# Patient Record
Sex: Female | Born: 1985 | Race: Black or African American | Hispanic: No | Marital: Single | Smoking: Current every day smoker
Health system: Southern US, Community
[De-identification: ages and names within clinical notes are randomized; demographics above are authoritative.]

## PROBLEM LIST (undated history)

## (undated) DIAGNOSIS — J45909 Unspecified asthma, uncomplicated: Secondary | ICD-10-CM

## (undated) HISTORY — DX: Unspecified asthma, uncomplicated: J45.909

## (undated) HISTORY — PX: TUBAL LIGATION: SHX77

---
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
## 2007-05-12 ENCOUNTER — Emergency Department: Payer: Self-pay | Admitting: Emergency Medicine

## 2007-08-21 ENCOUNTER — Emergency Department: Payer: Self-pay | Admitting: Emergency Medicine

## 2008-07-31 ENCOUNTER — Emergency Department: Payer: Self-pay | Admitting: Emergency Medicine

## 2009-08-08 ENCOUNTER — Inpatient Hospital Stay: Payer: Self-pay | Admitting: Obstetrics and Gynecology

## 2009-11-30 ENCOUNTER — Emergency Department: Payer: Self-pay | Admitting: Emergency Medicine

## 2010-05-24 DIAGNOSIS — Z62810 Personal history of physical and sexual abuse in childhood: Secondary | ICD-10-CM | POA: Insufficient documentation

## 2010-05-24 DIAGNOSIS — J45909 Unspecified asthma, uncomplicated: Secondary | ICD-10-CM | POA: Insufficient documentation

## 2010-09-15 ENCOUNTER — Ambulatory Visit: Payer: Self-pay | Admitting: Advanced Practice Midwife

## 2010-11-18 ENCOUNTER — Observation Stay: Payer: Self-pay

## 2010-12-08 ENCOUNTER — Ambulatory Visit: Payer: Self-pay | Admitting: Obstetrics and Gynecology

## 2010-12-09 ENCOUNTER — Inpatient Hospital Stay: Payer: Self-pay

## 2012-01-18 ENCOUNTER — Emergency Department: Payer: Self-pay | Admitting: Unknown Physician Specialty

## 2012-02-07 IMAGING — US US OB US >=[ID] SNGL FETUS
1 series · 17 of 28 positions shown · non-contrast
Comparison: none

REASON FOR EXAM: dates
COMMENTS:

[Series 1: us ob us >=(id) sngl fetus · 17 of 93 slices shown]
[im 1/93]
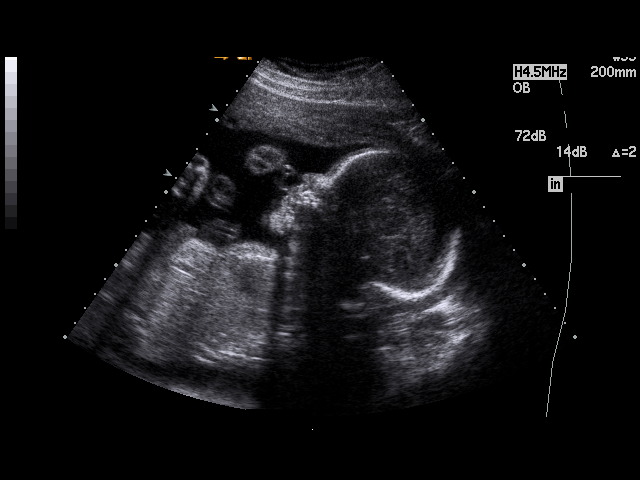
[im 7/93]
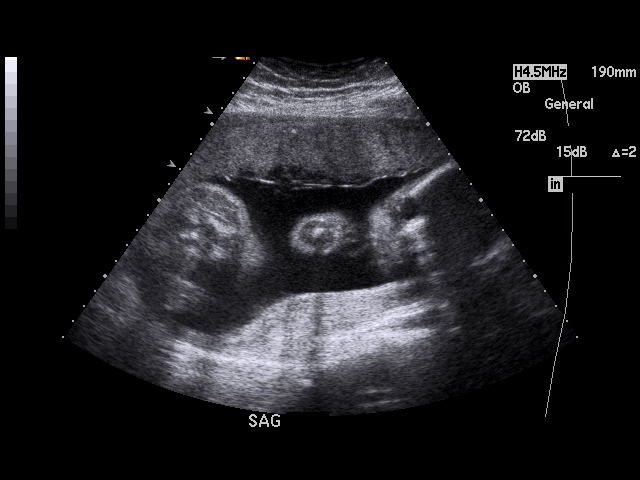
[im 14/93]
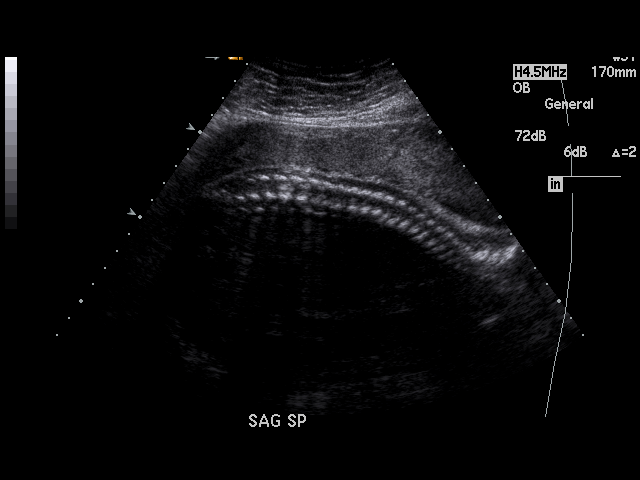
[im 18/93]
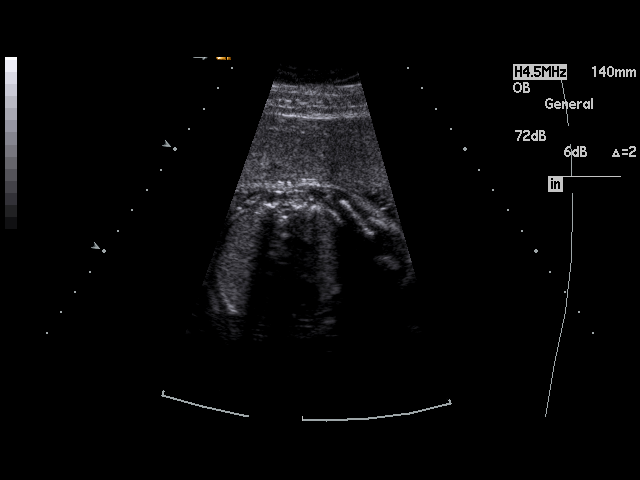
[im 24/93]
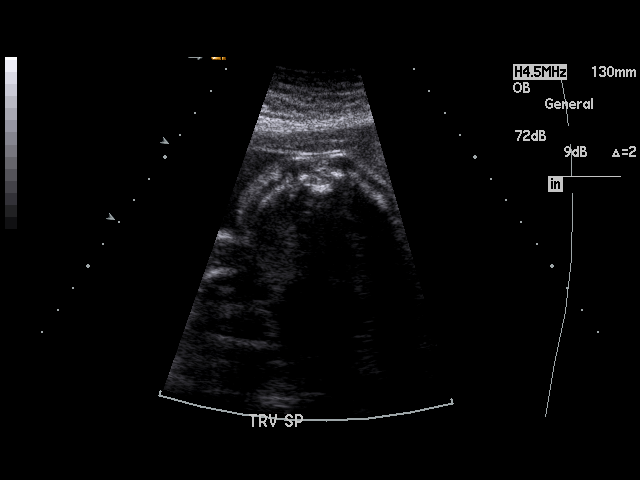
[im 31/93]
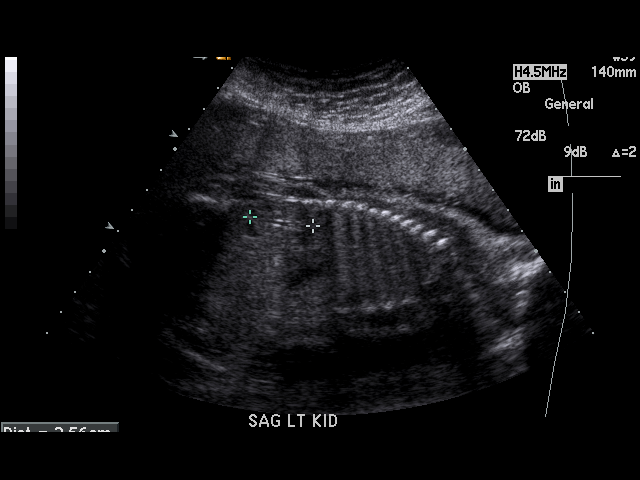
[im 35/93]
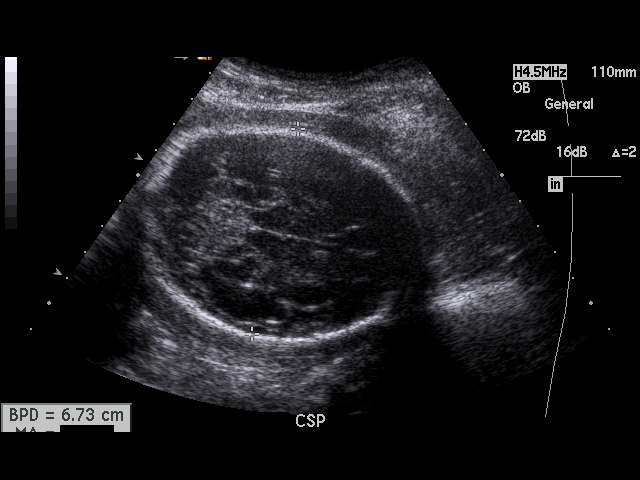
[im 41/93]
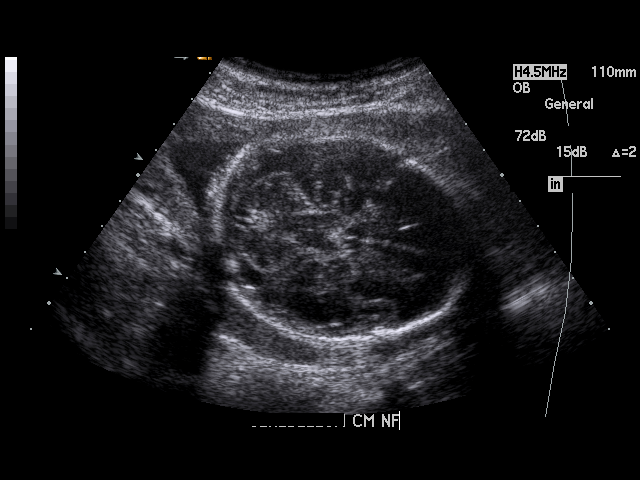
[im 48/93]
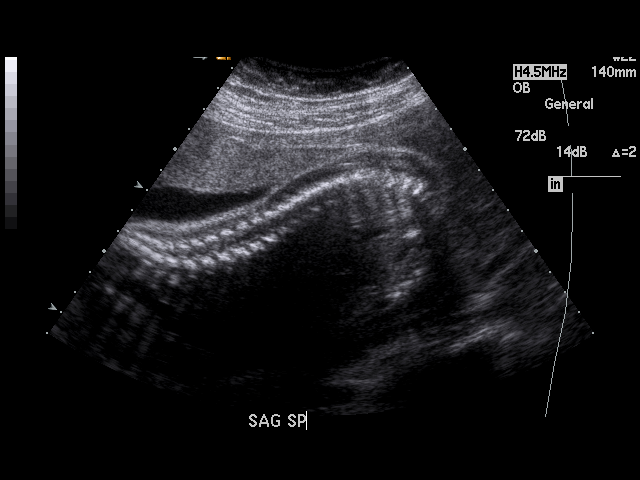
[im 52/93]
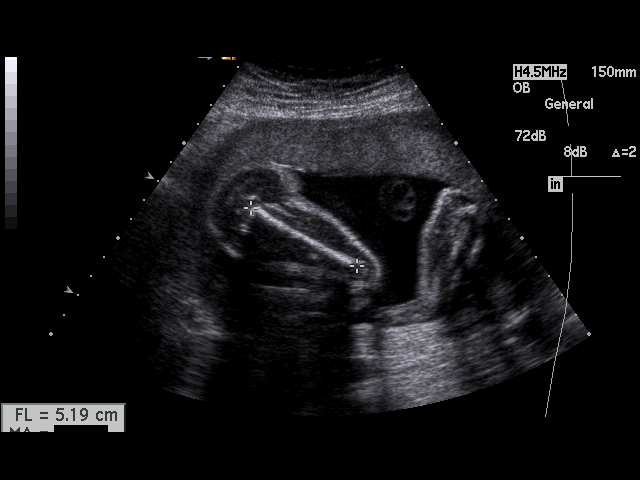
[im 58/93]
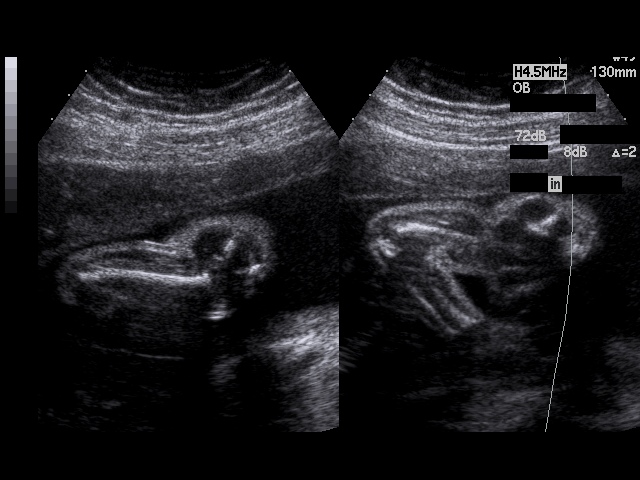
[im 62/93]
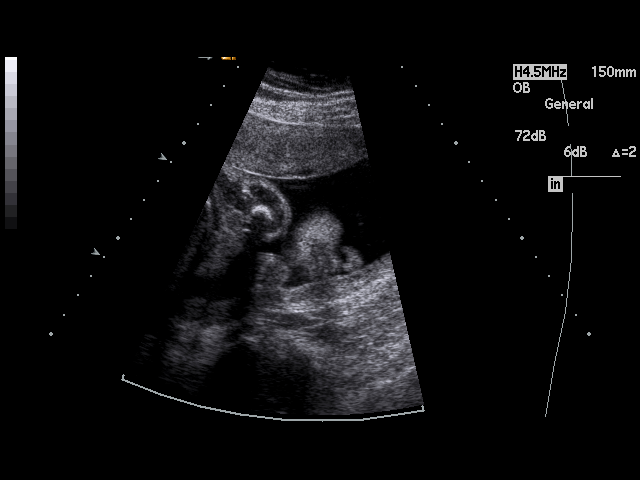
[im 69/93]
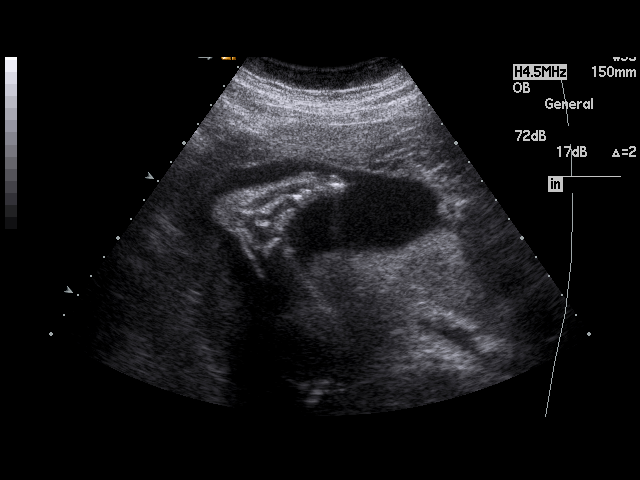
[im 75/93]
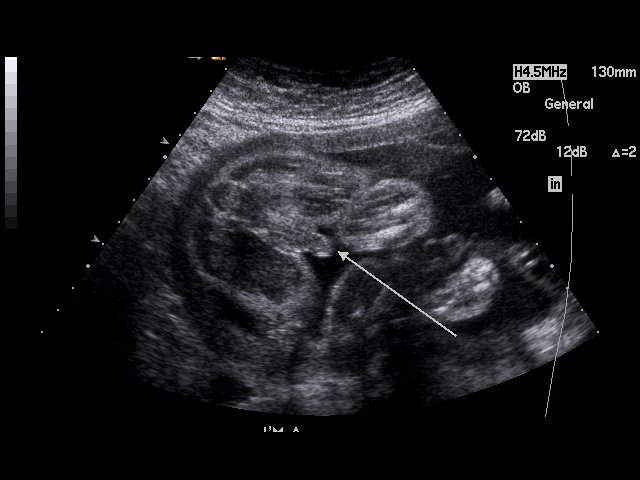
[im 79/93]
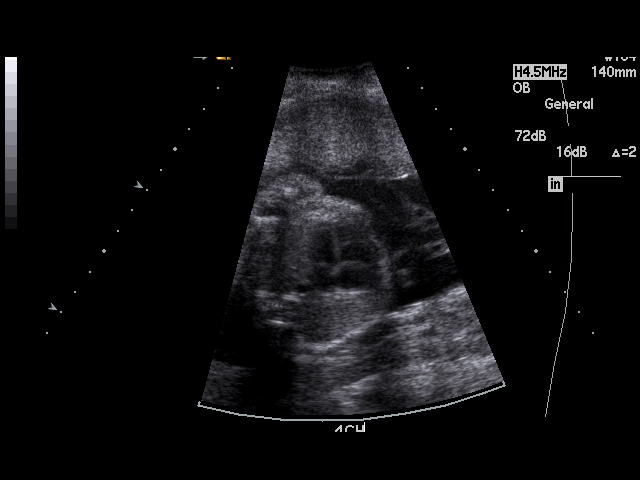
[im 86/93]
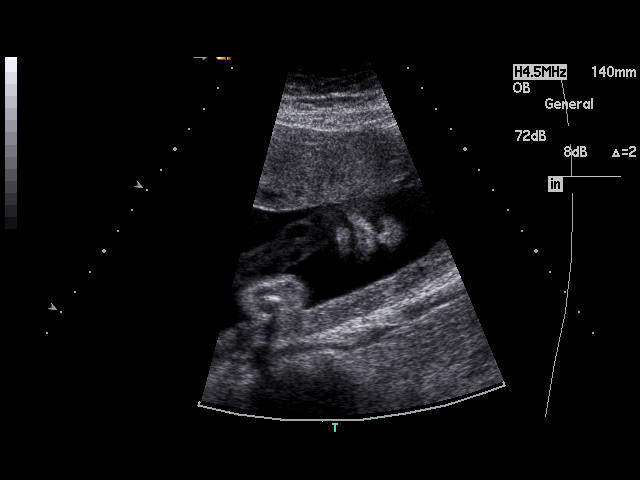
[im 93/93]
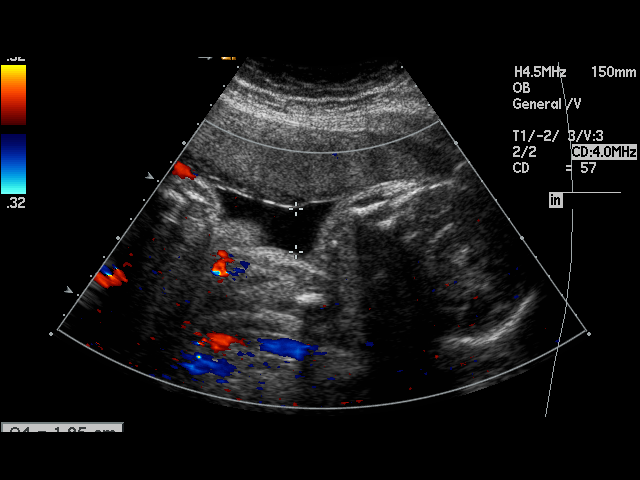

[17 of 28 positions shown; findings below may reference images not displayed]

PROCEDURE:     US  - US OB GREATER/OR EQUAL TO HGLZK  - September 15, 2010  [DATE]

RESULT:     OB ultrasound is performed and demonstrates a single
intrauterine fetus in cephalic presentation. The length of the cervix is
4.78 cm. The distance from the inferior margin of the anterior placenta to
the internal cervical os is 6.92 cm. The amniotic fluid index is measured at
15.35 cm. The fetal anatomy is grossly normal. The fetal heart rate is 144
beats per minute.
IMPRESSION: 1.     Single intrauterine gestation of 27 weeks-5 days + / - approximately 14
to 15 days. Ultrasound EDD is 12/12/2010. Estimated fetal weight is 2252 grams
+ / - 148 grams.

## 2012-04-24 ENCOUNTER — Emergency Department: Payer: Self-pay | Admitting: Emergency Medicine

## 2012-04-24 LAB — COMPREHENSIVE METABOLIC PANEL
Albumin: 3.9 g/dL (ref 3.4–5.0)
Alkaline Phosphatase: 87 U/L (ref 50–136)
Anion Gap: 5 — ABNORMAL LOW (ref 7–16)
Bilirubin,Total: 0.5 mg/dL (ref 0.2–1.0)
Chloride: 104 mmol/L (ref 98–107)
Co2: 27 mmol/L (ref 21–32)
Creatinine: 0.78 mg/dL (ref 0.60–1.30)
EGFR (Non-African Amer.): >60
Glucose: 114 mg/dL — ABNORMAL HIGH (ref 65–99)
Osmolality: 273 (ref 275–301)
Total Protein: 8.3 g/dL — ABNORMAL HIGH (ref 6.4–8.2)

## 2012-04-24 LAB — URINALYSIS, COMPLETE
Bilirubin,UR: NEGATIVE
Blood: NEGATIVE
Nitrite: NEGATIVE
Protein: 30
Squamous Epithelial: 43
WBC UR: 4 /HPF (ref 0–5)

## 2012-04-24 LAB — TSH: Thyroid Stimulating Horm: 1.32 u[IU]/mL

## 2012-04-24 LAB — CBC
HCT: 43.7 % (ref 35.0–47.0)
HGB: 14.6 g/dL (ref 12.0–16.0)
MCH: 29.8 pg (ref 26.0–34.0)
MCHC: 33.5 g/dL (ref 32.0–36.0)
MCV: 89 fL (ref 80–100)
Platelet: 196 10*3/uL (ref 150–440)

## 2012-04-24 LAB — SALICYLATE LEVEL: Salicylates, Serum: <1.7 mg/dL

## 2012-04-24 LAB — LIPASE, BLOOD: Lipase: 87 U/L (ref 73–393)

## 2012-08-16 LAB — HM PAP SMEAR

## 2012-12-03 ENCOUNTER — Inpatient Hospital Stay: Payer: Self-pay | Admitting: Obstetrics and Gynecology

## 2012-12-03 LAB — CBC WITH DIFFERENTIAL/PLATELET
Basophil #: 0 10*3/uL (ref 0.0–0.1)
Basophil %: 0 %
Eosinophil #: 0 10*3/uL (ref 0.0–0.7)
HCT: 33.4 % — ABNORMAL LOW (ref 35.0–47.0)
HGB: 10.8 g/dL — ABNORMAL LOW (ref 12.0–16.0)
Lymphocyte #: 0.9 10*3/uL — ABNORMAL LOW (ref 1.0–3.6)
Lymphocyte %: 7 %
MCH: 27.4 pg (ref 26.0–34.0)
MCV: 85 fL (ref 80–100)
Monocyte #: 1.1 x10 3/mm — ABNORMAL HIGH (ref 0.2–0.9)
Neutrophil #: 11.2 10*3/uL — ABNORMAL HIGH (ref 1.4–6.5)
Neutrophil %: 84.9 %
Platelet: 147 10*3/uL — ABNORMAL LOW (ref 150–440)
RBC: 3.94 10*6/uL (ref 3.80–5.20)
RDW: 14 % (ref 11.5–14.5)

## 2012-12-03 LAB — DRUG SCREEN, URINE
Barbiturates, Ur Screen: NEGATIVE (ref ?–200)
Benzodiazepine, Ur Scrn: NEGATIVE (ref ?–200)
Cannabinoid 50 Ng, Ur ~~LOC~~: POSITIVE (ref ?–50)
Cocaine Metabolite,Ur ~~LOC~~: NEGATIVE (ref ?–300)
MDMA (Ecstasy)Ur Screen: NEGATIVE (ref ?–500)
Methadone, Ur Screen: NEGATIVE (ref ?–300)

## 2012-12-04 LAB — PATHOLOGY REPORT

## 2012-12-04 LAB — HEMATOCRIT: HCT: 31.7 % — ABNORMAL LOW (ref 35.0–47.0)

## 2014-02-19 ENCOUNTER — Emergency Department: Payer: Self-pay | Admitting: Emergency Medicine

## 2014-12-26 NOTE — Op Note (Signed)
PATIENT NAME:  Darlene Mcgee, Darlene Mcgee MR#:  [id number] DATE OF BIRTH:  [birth year]  DATE OF PROCEDURE:  12/03/2012  PREOPERATIVE DIAGNOSES:  1.  37 + 6 weeks estimated gestational age.  2.  Active labor.  3.  Elective permanent sterilization. 4.  Prior cesarean section.   POSTOPERATIVE DIAGNOSES: 1.  37 + 6 weeks estimated gestational age.  2.  Active labor.  3.  Elective permanent sterilization. 4.  Prior cesarean section.  5.  Meconium staining of amniotic fluid.   PROCEDURE:  1.  Elective repeat low transverse cesarean section.  2.  Bilateral tubal ligation, [place].  3.  On-Q pump placement.   ANESTHESIA: Spinal.   SURGEON: Suzy Bouchardhomas J Rheana Casebolt, [name].   FIRST ASSISTANT: Ore, scrub tech.   INDICATION: This is a 29 year old, gravida 4, para 3 patient, who presented in active labor, cervix moving from 6 to 7 cm to 8 to 9 cm. She has had 2 prior cesarean sections. The patient has previously elected for permanent sterilization and reconfirms those desires the day of the procedure.   PROCEDURE: After adequate spinal anesthesia, the patient was placed in the dorsal supine position with a hip roll under the right side. The patient had previously received 2 grams IV Ancef for prophylaxis. Pfannenstiel incision was made 2 fingerbreadths above the symphysis pubis. Sharp dissection was used to identify the fascia. The fascia was opened in the midline and opened in a transverse fashion. Superior aspect of the fascia was grasped with Kocher clamps and the recti muscles dissected free. The inferior aspect of the fascia was grasped with Kocher clamps and the pyramidalis muscle was dissected free. Entry into the peritoneal cavity was accomplished sharply. The vesicouterine peritoneal fold was densely adherent and therefore a straight lower uterine segment incision was made. Upon entry into the endometrial cavity, meconium staining was noted. The incision was extended with blunt transverse traction. The fetal  head was delivered, followed by shoulders and body without difficulty. Cord doubly clamped and while passing the baby off to Dr. Abundio MiuHorowitz, the baby had a spontaneous respiration. Apgar scores assigned 5 and 9. Cord gas pH 7.0, CO2 91, base deficit of -8.1. The placenta had meconium staining and it was removed manually. The placenta will be sent to pathology for identification. Uterus was exteriorized and the endometrial cavity was wiped clean with laparotomy tape. Cervix was opened with a ring forceps and this was passed off the operative field. The uterine incision was closed with a running 1 chromic suture without difficulty. Good hemostasis noted. The posterior cul-de-sac was irrigated and suctioned.   Attention directed to the patient's right fallopian tube, which was grasped in the midportion and 2 separate 0 plain gut sutures were placed and a 1.5 cm portion of the fallopian tube removed. Good hemostasis was noted. A similar procedure was repeated on the patient's left fallopian tube. Again, 2-0 plain gut sutures were placed and a 1.5 cm portion of the fallopian tube removed. The uterus was placed back into the abdominal cavity without difficulty. The paracolic gutters were wiped clean with laparotomy tape. Both tubal ligation sites appeared hemostatic. Uterine incision appeared hemostatic. Interceed was placed over the lower uterine segment incision in a T-shaped fashion. The superior aspect of the fascia was grasped with Kocher clamps and elevated and the On-Q pump catheters were placed infraumbilically to a subfascial position. The fascia was then closed above these catheters with 0 Vicryl running nonlocking suture. Subcutaneous tissues were opened subcutaneously where there was previous  scar tissue and subcutaneous tissues were then irrigated and bovied for hemostasis, and the skin was reapproximated with staples. The On-Q pump catheters were then secured at the skin level with Dermabond and Steri-Strips  and Tegaderm. Each catheter was loaded with 5 mL of 0.5% Marcaine. There were no complications. Estimated blood loss 400 mL. Intraoperative fluids 600 mL. The patient tolerated the procedure well and was taken to the recovery room in good condition.    ____________________________ Suzy Bouchard, MD tjs:aw Mcgee: 12/03/2012 04:20:21 ET T: 12/03/2012 08:10:53 ET JOB#: [id number]  cc: Suzy Bouchard, MD, <Dictator> Suzy Bouchard MD ELECTRONICALLY SIGNED 12/10/2012 9:29

## 2014-12-26 NOTE — Discharge Summary (Signed)
PATIENT NAME:  Darlene Mcgee, Darlene Mcgee MR#:  [id number] DATE OF BIRTH:  01-24-1986  DATE OF ADMISSION:  12/03/2012 DATE OF DISCHARGE:  12/06/2012  PREOPERATIVE DIAGNOSIS: Term gestation, active labor, previous cesarean section.  DISCHARGE DIAGNOSIS: Repeat low transverse cesarean section.   HOSPITAL COURSE: The patient was admitted to [facility] in active labor, cervix 8 to 9 cm. The patient with a prior cesarean section. The patient underwent uncomplicated repeat low transverse cesarean section and tubal ligation. The patient's postop day number 1 hematocrit 31.7%. She was discharged home in good condition on postop day number 3.  The patient will follow up Dr. Feliberto GottronSchermerhorn in 2 weeks or before if she has wound drainage, fever, nausea, or vomiting.   DISCHARGE MEDICATIONS:  Norco, Motrin and prenatal vitamins.   DISCHARGE FOLLOWUP:  With Dr. Feliberto GottronSchermerhorn in 2 weeks. ____________________________ Suzy Bouchardhomas J. Schermerhorn, MD tjs:sb Mcgee: 12/12/2012 09:45:44 ET T: 12/12/2012 10:00:35 ET JOB#: [id number]  cc: Suzy Bouchardhomas J. Schermerhorn, MD, <Dictator> Suzy BouchardHOMAS J SCHERMERHORN MD ELECTRONICALLY SIGNED 12/17/2012 9:25

## 2015-01-04 ENCOUNTER — Emergency Department
Admission: EM | Admit: 2015-01-04 | Discharge: 2015-01-04 | Disposition: A | Discharge status: Home / Self Care | Payer: Self-pay | Attending: Student | Admitting: Student

## 2015-01-04 ENCOUNTER — Encounter: Payer: Self-pay | Admitting: *Deleted

## 2015-01-04 DIAGNOSIS — R109 Unspecified abdominal pain: Secondary | ICD-10-CM | POA: Insufficient documentation

## 2015-01-04 DIAGNOSIS — R112 Nausea with vomiting, unspecified: Secondary | ICD-10-CM | POA: Insufficient documentation

## 2015-01-04 DIAGNOSIS — Z3202 Encounter for pregnancy test, result negative: Secondary | ICD-10-CM | POA: Insufficient documentation

## 2015-01-04 DIAGNOSIS — R197 Diarrhea, unspecified: Secondary | ICD-10-CM | POA: Insufficient documentation

## 2015-01-04 DIAGNOSIS — Z720 Tobacco use: Secondary | ICD-10-CM | POA: Insufficient documentation

## 2015-01-04 LAB — CBC WITH DIFFERENTIAL/PLATELET
Basophils Absolute: 0.1 10*3/uL (ref 0–0.1)
Basophils Relative: 1 %
EOS ABS: 0 10*3/uL (ref 0–0.7)
HCT: 40.1 % (ref 35.0–47.0)
Hemoglobin: 13 g/dL (ref 12.0–16.0)
LYMPHS ABS: 0.8 10*3/uL — AB (ref 1.0–3.6)
Lymphocytes Relative: 7 %
MCH: 28.8 pg (ref 26.0–34.0)
MCHC: 32.4 g/dL (ref 32.0–36.0)
MCV: 88.9 fL (ref 80.0–100.0)
Monocytes Absolute: 0.3 10*3/uL (ref 0.2–0.9)
Neutro Abs: 9.5 10*3/uL — ABNORMAL HIGH (ref 1.4–6.5)
Neutrophils Relative %: 89 %
PLATELETS: 172 10*3/uL (ref 150–440)
RBC: 4.51 MIL/uL (ref 3.80–5.20)
RDW: 14.4 % (ref 11.5–14.5)
WBC: 10.6 10*3/uL (ref 3.6–11.0)

## 2015-01-04 LAB — COMPREHENSIVE METABOLIC PANEL
ALBUMIN: 3.5 g/dL (ref 3.5–5.0)
ALT: 16 U/L (ref 14–54)
ANION GAP: 6 (ref 5–15)
AST: 21 U/L (ref 15–41)
Alkaline Phosphatase: 45 U/L (ref 38–126)
BUN: 13 mg/dL (ref 6–20)
CHLORIDE: 110 mmol/L (ref 101–111)
CO2: 25 mmol/L (ref 22–32)
Calcium: 8.6 mg/dL — ABNORMAL LOW (ref 8.9–10.3)
Creatinine, Ser: 0.69 mg/dL (ref 0.44–1.00)
GFR calc Af Amer: >60 mL/min (ref >60)
GFR calc non Af Amer: >60 mL/min (ref >60)
Glucose, Bld: 128 mg/dL — ABNORMAL HIGH (ref 65–99)
POTASSIUM: 4.1 mmol/L (ref 3.5–5.1)
Sodium: 141 mmol/L (ref 135–145)
TOTAL PROTEIN: 6.3 g/dL — AB (ref 6.5–8.1)
Total Bilirubin: 0.4 mg/dL (ref 0.3–1.2)

## 2015-01-04 LAB — HCG, QUANTITATIVE, PREGNANCY

## 2015-01-04 LAB — LIPASE, BLOOD: LIPASE: 22 U/L (ref 22–51)

## 2015-01-04 MED ORDER — KETOROLAC TROMETHAMINE 30 MG/ML IJ SOLN
INTRAMUSCULAR | Status: AC
  Filled 2015-01-04: qty 1

## 2015-01-04 MED ORDER — ONDANSETRON HCL 4 MG/2ML IJ SOLN
4.0000 mg | Freq: Once | INTRAMUSCULAR | Status: AC
  Administered 2015-01-04: 4 mg via INTRAVENOUS

## 2015-01-04 MED ORDER — KETOROLAC TROMETHAMINE 30 MG/ML IJ SOLN
30.0000 mg | Freq: Once | INTRAMUSCULAR | Status: AC
Start: 2015-01-04 — End: 2015-01-04
  Administered 2015-01-04: 30 mg via INTRAVENOUS

## 2015-01-04 MED ORDER — OXYCODONE-ACETAMINOPHEN 5-325 MG PO TABS
ORAL_TABLET | ORAL | Status: AC
  Filled 2015-01-04: qty 1

## 2015-01-04 MED ORDER — ONDANSETRON HCL 4 MG PO TABS: 4.0000 mg | ORAL_TABLET | Freq: Three times a day (TID) | ORAL | Status: AC | PRN

## 2015-01-04 MED ORDER — SODIUM CHLORIDE 0.9 % IV BOLUS (SEPSIS)
1000.0000 mL | Freq: Once | INTRAVENOUS | Status: AC
  Administered 2015-01-04: 1000 mL via INTRAVENOUS

## 2015-01-04 MED ORDER — ONDANSETRON 4 MG PO TBDP
4.0000 mg | ORAL_TABLET | Freq: Once | ORAL | Status: AC
  Administered 2015-01-04: 4 mg via ORAL

## 2015-01-04 MED ORDER — NAPROXEN 500 MG PO TABS: 500.0000 mg | ORAL_TABLET | Freq: Two times a day (BID) | ORAL | Status: AC

## 2015-01-04 MED ORDER — OXYCODONE-ACETAMINOPHEN 5-325 MG PO TABS
1.0000 | ORAL_TABLET | Freq: Once | ORAL | Status: AC
  Administered 2015-01-04: 1 via ORAL

## 2015-01-04 MED ORDER — ONDANSETRON 4 MG PO TBDP
ORAL_TABLET | ORAL | Status: AC
  Filled 2015-01-04: qty 1

## 2015-01-04 MED ORDER — ONDANSETRON HCL 4 MG/2ML IJ SOLN
INTRAMUSCULAR | Status: AC
  Filled 2015-01-04: qty 2

## 2015-01-04 NOTE — ED Notes (Signed)
Unable to secure electronic signature from pt due to lack of pad. Pt verbalizes understanding of discharge instructions and followup.

## 2015-01-04 NOTE — ED Notes (Signed)
Pt states nausea improved.

## 2015-01-04 NOTE — ED Notes (Signed)
Pt reports that she started her period this morning and is experiencing n/v/d "like she does with every period".

## 2015-01-04 NOTE — ED Notes (Signed)
Pt given warm blanket and lights dimmed. Pt resting in bed with call bell in reach. Pt in no acute distress at this time. Pts daughter and girlfriend no longer at bedside.

## 2015-01-04 NOTE — ED Provider Notes (Signed)
[facility] Emergency Department Provider Note    ____________________________________________  Time seen: ----------------------------------------- 8:25 PM on 01/04/2015 -----------------------------------------    I have reviewed the triage vital signs and the nursing notes.   HISTORY  Chief Complaint Emesis     HPI Darlene Mcgee is a 29 y.o. female with history of asthma presents for evaluation of nausea vomiting diarrhea today. Nonbloody nonbilious emesis. She reports she started menstruating today and she has these symptoms monthly, they are associated with the onset of menstruation. She reports that she has them monthly. She has taken one Percocet at home and an over-the-counter menstrual pain reliever however these have not helped her symptoms. Onset sudden, since approximately 5 AM this morning. No new meds. No modifying factors. No fevers, chest pain, urinary complaints. She did drink alcohol last night.     History reviewed. No pertinent past medical history.  There are no active problems to display for this patient.   History reviewed. No pertinent past surgical history.  Current Outpatient Rx  Name  Route  Sig  Dispense  Refill  . naproxen (NAPROSYN) 500 MG tablet   Oral   Take 1 tablet (500 mg total) by mouth 2 (two) times daily with a meal.   8 tablet   0   . ondansetron (ZOFRAN) 4 MG tablet   Oral   Take 1 tablet (4 mg total) by mouth every 8 (eight) hours as needed for nausea or vomiting.   12 tablet   0     Allergies Review of patient's allergies indicates no known allergies.  No family history on file.  Social History History  Substance Use Topics  . Smoking status: Current Every Day Smoker  . Smokeless tobacco: Not on file  . Alcohol Use: Yes    Review of Systems  Constitutional: Negative for fever. Eyes: Negative for visual changes. ENT: Negative for sore throat. Cardiovascular: Negative for chest  pain. Respiratory: Negative for shortness of breath. Gastrointestinal: Positive for abdominal pain, vomiting and diarrhea. Genitourinary: Negative for dysuria. Musculoskeletal: Negative for back pain. Skin: Negative for rash. Neurological: Negative for headaches, focal weakness or numbness.   10-point ROS otherwise negative.  ____________________________________________   PHYSICAL EXAM:  VITAL SIGNS: ED Triage Vitals  Enc Vitals Group     BP 01/04/15 1643 153/86 mmHg     Pulse Rate 01/04/15 1643 78     Resp 01/04/15 1643 18     Temp 01/04/15 1643 98.5 F (36.9 C)     Temp Source 01/04/15 1643 Oral     SpO2 01/04/15 1643 98 %     Weight 01/04/15 1643 197 lb (89.359 kg)     Height 01/04/15 1643  (1.626 m)     Head Cir --      Peak Flow --      Pain Score 01/04/15 1644 8     Pain Loc --      Pain Edu? --      Excl. in GC? --      Constitutional: Alert and oriented. Well appearing and in no distress. Eyes: Conjunctivae are normal. PERRL. Normal extraocular movements. ENT   Head: Normocephalic and atraumatic.   Nose: No congestion/rhinnorhea.   Mouth/Throat: Mucous membranes are moist.   Neck: No stridor. Hematological/Lymphatic/Immunilogical: No cervical lymphadenopathy. Cardiovascular: Normal rate, regular rhythm. Normal and symmetric distal pulses are present in all extremities. No murmurs, rubs, or gallops. Respiratory: Normal respiratory effort without tachypnea nor retractions. Breath sounds are clear  and equal bilaterally. No wheezes/rales/rhonchi. Gastrointestinal: Soft and nontender. No distention. No abdominal bruits. There is no CVA tenderness. Musculoskeletal: Nontender with normal range of motion in all extremities. No joint effusions.  No lower extremity tenderness nor edema. Neurologic:  Normal speech and language. No gross focal neurologic deficits are appreciated. Speech is normal. No gait instability. Skin:  Skin is warm, dry and intact.  No rash noted. Psychiatric: Mood and affect are normal. Speech and behavior are normal. Patient exhibits appropriate insight and judgment.    ____________________________________________   PROCEDURES  Procedure(s) performed: None  Critical Care performed: No  ____________________________________________   INITIAL IMPRESSION / ASSESSMENT AND PLAN / ED COURSE  Pertinent labs & imaging results that were available during my care of the patient were reviewed by me and considered in my medical decision making (see chart for details).  Darlene D Dan HumphreysMebane is a 29 y.o. female with history of asthma presents for evaluation of nausea vomiting diarrhea today. This happens to her monthly. On exam she is very well-appearing and in no acute distress. Vital signs stable. Labs unremarkable. She is not pregnant. She has refused to make a urine specimen, it would be contaminated anyway, and she has no urinary complaints so we'll cancel. Symptoms have improved. Doubt any acute life-threatening intra-abdominal pathology. DC home with supportive care and PCP follow-up.  ____________________________________________   FINAL CLINICAL IMPRESSION(S) / ED DIAGNOSES  Final diagnoses:  Nausea vomiting and diarrhea     Gayla DossEryka A Aric Jost, MD 01/05/15 [phone]

## 2015-01-13 NOTE — H&P (Signed)
L&D Evaluation:  History:  HPI G4 p3 at 37+6 weeks with 2 prior c/s Pt scheduled for repeat c/s Active labor 6-7 cm. Pt recommitts to BTLEngineer, manufacturing( papaerwork signed)   Patient's Medical History depression , + tobacco , THC  uwse, HGSIL   Patient's Surgical History Previous C-Section  x 2   Medications Pre Natal Vitamins   Allergies NKDA   Social History tobacco   Family History Non-Contributory   ROS:  ROS All systems were reviewed.  HEENT, CNS, GI, GU, Respiratory, CV, Renal and Musculoskeletal systems were found to be normal.   Exam:  Vital Signs BP >140/90  151/89   General no apparent distress   Mental Status clear   Chest clear   Heart normal sinus rhythm   Abdomen gravid, non-tender   Pelvic 8-9 cm   Mebranes Intact   FHT normal rate with no decels   Ucx irregular, s/p terb x 2   Impression:  Impression active labor, elective repeat c/s + btl   Plan:  Plan repeat LTCS and + BTL , on Q pump   Electronic Signatures: Shannette Tabares, Ihor Austinhomas J (MD)  (Signed 31-Mar-14 03:17)  Authored: L&D Evaluation   Last Updated: 31-Mar-14 03:17 by Suzy BouchardSchermerhorn, Destani Wamser J (MD)

## 2015-05-08 DIAGNOSIS — Z8659 Personal history of other mental and behavioral disorders: Secondary | ICD-10-CM | POA: Insufficient documentation

## 2015-10-20 ENCOUNTER — Emergency Department
Admission: EM | Admit: 2015-10-20 | Discharge: 2015-10-20 | Disposition: A | Discharge status: Home / Self Care | Payer: Self-pay | Attending: Emergency Medicine | Admitting: Emergency Medicine

## 2015-10-20 ENCOUNTER — Encounter: Payer: Self-pay | Admitting: Emergency Medicine

## 2015-10-20 DIAGNOSIS — Z791 Long term (current) use of non-steroidal anti-inflammatories (NSAID): Secondary | ICD-10-CM | POA: Insufficient documentation

## 2015-10-20 DIAGNOSIS — R1084 Generalized abdominal pain: Secondary | ICD-10-CM | POA: Insufficient documentation

## 2015-10-20 DIAGNOSIS — R112 Nausea with vomiting, unspecified: Secondary | ICD-10-CM | POA: Insufficient documentation

## 2015-10-20 DIAGNOSIS — R109 Unspecified abdominal pain: Secondary | ICD-10-CM

## 2015-10-20 DIAGNOSIS — F17200 Nicotine dependence, unspecified, uncomplicated: Secondary | ICD-10-CM | POA: Insufficient documentation

## 2015-10-20 DIAGNOSIS — R197 Diarrhea, unspecified: Secondary | ICD-10-CM | POA: Insufficient documentation

## 2015-10-20 DIAGNOSIS — Z3202 Encounter for pregnancy test, result negative: Secondary | ICD-10-CM | POA: Insufficient documentation

## 2015-10-20 LAB — COMPREHENSIVE METABOLIC PANEL
ALBUMIN: 3.7 g/dL (ref 3.5–5.0)
ALT: 15 U/L (ref 14–54)
AST: 18 U/L (ref 15–41)
Alkaline Phosphatase: 53 U/L (ref 38–126)
Anion gap: 5 (ref 5–15)
BUN: 12 mg/dL (ref 6–20)
CO2: 24 mmol/L (ref 22–32)
Calcium: 8.6 mg/dL — ABNORMAL LOW (ref 8.9–10.3)
Chloride: 111 mmol/L (ref 101–111)
Creatinine, Ser: 0.67 mg/dL (ref 0.44–1.00)
GFR calc non Af Amer: >60 mL/min (ref >60)
GLUCOSE: 138 mg/dL — AB (ref 65–99)
POTASSIUM: 3.8 mmol/L (ref 3.5–5.1)
SODIUM: 140 mmol/L (ref 135–145)
TOTAL PROTEIN: 6.5 g/dL (ref 6.5–8.1)
Total Bilirubin: 0.8 mg/dL (ref 0.3–1.2)

## 2015-10-20 LAB — CBC
HEMATOCRIT: 40.8 % (ref 35.0–47.0)
HEMOGLOBIN: 13.4 g/dL (ref 12.0–16.0)
MCH: 28.9 pg (ref 26.0–34.0)
MCHC: 32.8 g/dL (ref 32.0–36.0)
MCV: 88.3 fL (ref 80.0–100.0)
Platelets: 158 10*3/uL (ref 150–440)
RBC: 4.62 MIL/uL (ref 3.80–5.20)
RDW: 14.6 % — ABNORMAL HIGH (ref 11.5–14.5)
WBC: 7.8 10*3/uL (ref 3.6–11.0)

## 2015-10-20 LAB — URINALYSIS COMPLETE WITH MICROSCOPIC (ARMC ONLY)
BILIRUBIN URINE: NEGATIVE
Glucose, UA: NEGATIVE mg/dL
Nitrite: NEGATIVE
PROTEIN: 100 mg/dL — AB
Specific Gravity, Urine: 1.027 (ref 1.005–1.030)
pH: 8 (ref 5.0–8.0)

## 2015-10-20 LAB — LIPASE, BLOOD: Lipase: 18 U/L (ref 11–51)

## 2015-10-20 LAB — POCT PREGNANCY, URINE: Preg Test, Ur: NEGATIVE

## 2015-10-20 MED ORDER — PROMETHAZINE HCL 25 MG/ML IJ SOLN
INTRAMUSCULAR | Status: AC
  Administered 2015-10-20: 25 mg via INTRAMUSCULAR
  Filled 2015-10-20: qty 1

## 2015-10-20 MED ORDER — IBUPROFEN 800 MG PO TABS
ORAL_TABLET | ORAL | Status: AC
  Administered 2015-10-20: 800 mg via ORAL
  Filled 2015-10-20: qty 1

## 2015-10-20 MED ORDER — IBUPROFEN 800 MG PO TABS: 800.0000 mg | ORAL_TABLET | Freq: Three times a day (TID) | ORAL | Status: DC | PRN

## 2015-10-20 MED ORDER — IBUPROFEN 800 MG PO TABS
800.0000 mg | ORAL_TABLET | Freq: Once | ORAL | Status: AC
  Administered 2015-10-20: 800 mg via ORAL

## 2015-10-20 MED ORDER — ONDANSETRON 4 MG PO TBDP
4.0000 mg | ORAL_TABLET | Freq: Once | ORAL | Status: AC | PRN
  Administered 2015-10-20: 4 mg via ORAL
  Filled 2015-10-20 (×2): qty 1

## 2015-10-20 MED ORDER — TRAMADOL HCL 50 MG PO TABS
ORAL_TABLET | ORAL | Status: AC
  Administered 2015-10-20: 100 mg via ORAL
  Filled 2015-10-20: qty 2

## 2015-10-20 MED ORDER — ONDANSETRON 4 MG PO TBDP: 4.0000 mg | ORAL_TABLET | Freq: Three times a day (TID) | ORAL | Status: DC | PRN

## 2015-10-20 MED ORDER — TRAMADOL HCL 50 MG PO TABS
100.0000 mg | ORAL_TABLET | Freq: Once | ORAL | Status: AC
  Administered 2015-10-20: 100 mg via ORAL

## 2015-10-20 MED ORDER — PROMETHAZINE HCL 25 MG/ML IJ SOLN
25.0000 mg | Freq: Once | INTRAMUSCULAR | Status: AC
  Administered 2015-10-20: 25 mg via INTRAMUSCULAR

## 2015-10-20 MED ORDER — TRAMADOL HCL 50 MG PO TABS: 50.0000 mg | ORAL_TABLET | Freq: Four times a day (QID) | ORAL | Status: AC | PRN

## 2015-10-20 NOTE — ED Notes (Signed)
Pt delayed from discharge for approx 30 min due to pt vomiting and c/o staying. MD notified and given orders to hold for a short period of time

## 2015-10-20 NOTE — Discharge Instructions (Signed)
Please take your medications as prescribed. Please follow-up with your OB/GYN as soon as possible for further evaluation. Return to the emergency department for any worsening abdominal pain or fever.   Abdominal Pain, Adult Many things can cause abdominal pain. Usually, abdominal pain is not caused by a disease and will improve without treatment. It can often be observed and treated at home. Your health care provider will do a physical exam and possibly order blood tests and X-rays to help determine the seriousness of your pain. However, in many cases, more time must pass before a clear cause of the pain can be found. Before that point, your health care provider may not know if you need more testing or further treatment. HOME CARE INSTRUCTIONS Monitor your abdominal pain for any changes. The following actions may help to alleviate any discomfort you are experiencing:  Only take over-the-counter or prescription medicines as directed by your health care provider.  Do not take laxatives unless directed to do so by your health care provider.  Try a clear liquid diet (broth, tea, or water) as directed by your health care provider. Slowly move to a bland diet as tolerated. SEEK MEDICAL CARE IF:  You have unexplained abdominal pain.  You have abdominal pain associated with nausea or diarrhea.  You have pain when you urinate or have a bowel movement.  You experience abdominal pain that wakes you in the night.  You have abdominal pain that is worsened or improved by eating food.  You have abdominal pain that is worsened with eating fatty foods.  You have a fever. SEEK IMMEDIATE MEDICAL CARE IF:  Your pain does not go away within 2 hours.  You keep throwing up (vomiting).  Your pain is felt only in portions of the abdomen, such as the right side or the left lower portion of the abdomen.  You pass bloody or black tarry stools. MAKE SURE YOU:  Understand these instructions.  Will watch  your condition.  Will get help right away if you are not doing well or get worse.   This information is not intended to replace advice given to you by your health care provider. Make sure you discuss any questions you have with your health care provider.   Document Released: 06/01/2005 Document Revised: 05/13/2015 Document Reviewed: 05/01/2013 Elsevier Interactive Patient Education 2016 [employer]  Nausea and Vomiting Nausea means you feel sick to your stomach. Throwing up (vomiting) is a reflex where stomach contents come out of your mouth. HOME CARE   Take medicine as told by your doctor.  Do not force yourself to eat. However, you do need to drink fluids.  If you feel like eating, eat a normal diet as told by your doctor.  Eat rice, wheat, potatoes, bread, lean meats, yogurt, fruits, and vegetables.  Avoid high-fat foods.  Drink enough fluids to keep your pee (urine) clear or pale yellow.  Ask your doctor how to replace body fluid losses (rehydrate). Signs of body fluid loss (dehydration) include:  Feeling very thirsty.  Dry lips and mouth.  Feeling dizzy.  Dark pee.  Peeing less than normal.  Feeling confused.  Fast breathing or heart rate. GET HELP RIGHT AWAY IF:   You have blood in your throw up.  You have black or bloody poop (stool).  You have a bad headache or stiff neck.  You feel confused.  You have bad belly (abdominal) pain.  You have chest pain or trouble breathing.  You do not pee  at least once every 8 hours.  You have cold, clammy skin.  You keep throwing up after 24 to 48 hours.  You have a fever. MAKE SURE YOU:   Understand these instructions.  Will watch your condition.  Will get help right away if you are not doing well or get worse.   This information is not intended to replace advice given to you by your health care provider. Make sure you discuss any questions you have with your health care provider.   Document  Released: 02/08/2008 Document Revised: 11/14/2011 Document Reviewed: 01/21/2011 Elsevier Interactive Patient Education [employer]

## 2015-10-20 NOTE — ED Notes (Signed)
Pt c/o vomiting and diarrhea since last night.  Stated period and per friend sometimes vomits with period but not this much. Having difficulty keeping food down.

## 2015-10-20 NOTE — ED Notes (Signed)
Lab called to add on UA.  ?

## 2015-10-20 NOTE — ED Provider Notes (Signed)
[facility] Emergency Department Provider Note  Time seen: 4:59 PM  I have reviewed the triage vital signs and the nursing notes.   HISTORY  Chief Complaint Emesis    HPI Darlene Mcgee is a 30 y.o. female with no past medical history who presents to the emergency department with abdominal cramping, nausea, vomiting, diarrhea. According to the patient every month she gets the same symptoms with her. Some months are worse than others, she states this month has been very significant with the nausea and the vomiting along with lower abdominal cramping and diarrhea. Patient states she cannot go to work last night due to the pain, and does not believe she can go to work tonight, so she came to the emergency department per patient. Patient is having difficulty keeping food down. Received Zofran during triage, states the nausea has improved, continues to have loose stool her last bowel movement was approximately 2 hours ago. Denies fever.     History reviewed. No pertinent past medical history.  There are no active problems to display for this patient.   Past Surgical History  Procedure Laterality Date  . Cesarean section      Current Outpatient Rx  Name  Route  Sig  Dispense  Refill  . naproxen (NAPROSYN) 500 MG tablet   Oral   Take 1 tablet (500 mg total) by mouth 2 (two) times daily with a meal.   8 tablet   0   . ondansetron (ZOFRAN) 4 MG tablet   Oral   Take 1 tablet (4 mg total) by mouth every 8 (eight) hours as needed for nausea or vomiting.   12 tablet   0     Allergies Shellfish allergy  History reviewed. No pertinent family history.  Social History Social History  Substance Use Topics  . Smoking status: Current Every Day Smoker  . Smokeless tobacco: None  . Alcohol Use: Yes    Review of Systems Constitutional: Negative for fever. Cardiovascular: Negative for chest pain. Respiratory: Negative for shortness of  breath. Gastrointestinal: Abdominal cramping. Positive for nausea, vomiting, diarrhea. Genitourinary: Negative for dysuria. Currently on her period. Musculoskeletal: Negative for back pain. Neurological: Negative for headache 10-point ROS otherwise negative.  ____________________________________________   PHYSICAL EXAM:  VITAL SIGNS: ED Triage Vitals  Enc Vitals Group     BP 10/20/15 1618 158/89 mmHg     Pulse Rate 10/20/15 1618 57     Resp 10/20/15 1618 18     Temp 10/20/15 1618 98.3 F (36.8 C)     Temp Source 10/20/15 1618 Oral     SpO2 10/20/15 1618 98 %     Weight 10/20/15 1618 195 lb (88.451 kg)     Height 10/20/15 1618  (1.651 m)     Head Cir --      Peak Flow --      Pain Score 10/20/15 1617 10     Pain Loc --      Pain Edu? --      Excl. in GC? --     Constitutional: Alert and oriented. Well appearing and in no distress. Eyes: Normal exam ENT   Head: Normocephalic and atraumatic.   Mouth/Throat: Mucous membranes are moist. Cardiovascular: Normal rate, regular rhythm. No murmur Respiratory: Normal respiratory effort without tachypnea nor retractions. Breath sounds are clear Gastrointestinal: Soft, mild diffuse abdominal tenderness without focal tenderness. No rebound or guarding. No distention. Musculoskeletal: Nontender with normal range of motion in all extremities.  Neurologic:  Normal speech and language. No gross focal neurologic deficits Skin:  Skin is warm, dry and intact.  Psychiatric: Mood and affect are normal.   ____________________________________________   INITIAL IMPRESSION / ASSESSMENT AND PLAN / ED COURSE  Pertinent labs & imaging results that were available during my care of the patient were reviewed by me and considered in my medical decision making (see chart for details).  Patient presents with abdominal cramping, diarrhea, nausea or vomiting. Patient states she gets these symptoms every month with her period, but this month  it is more severe. Denies fever. Patient's labs are within normal limits. I discussed the possibility of gastroenteritis, however the patient states she is sure it is just her period Because this happens every month and feels the same per patient. Patient's labs are reassuring, no focal tenderness on examination. We will discharge with Zofran, Motrin, and a short course of Ultram. Patient is to follow up with her OB/GYN.  ____________________________________________   FINAL CLINICAL IMPRESSION(S) / ED DIAGNOSES  Abdominal cramping Nausea, vomiting, diarrhea   Minna Antis, MD 10/20/15 [id number]

## 2018-02-21 ENCOUNTER — Emergency Department
Admission: EM | Admit: 2018-02-21 | Discharge: 2018-02-21 | Disposition: A | Discharge status: Home / Self Care | Payer: Self-pay | Attending: Emergency Medicine | Admitting: Emergency Medicine

## 2018-02-21 ENCOUNTER — Other Ambulatory Visit: Payer: Self-pay

## 2018-02-21 ENCOUNTER — Emergency Department: Payer: Self-pay

## 2018-02-21 DIAGNOSIS — S62661B Nondisplaced fracture of distal phalanx of left index finger, initial encounter for open fracture: Secondary | ICD-10-CM | POA: Insufficient documentation

## 2018-02-21 DIAGNOSIS — S61201A Unspecified open wound of left index finger without damage to nail, initial encounter: Secondary | ICD-10-CM | POA: Insufficient documentation

## 2018-02-21 DIAGNOSIS — Y929 Unspecified place or not applicable: Secondary | ICD-10-CM | POA: Insufficient documentation

## 2018-02-21 DIAGNOSIS — W230XXA Caught, crushed, jammed, or pinched between moving objects, initial encounter: Secondary | ICD-10-CM | POA: Insufficient documentation

## 2018-02-21 DIAGNOSIS — Z23 Encounter for immunization: Secondary | ICD-10-CM | POA: Insufficient documentation

## 2018-02-21 DIAGNOSIS — S61209A Unspecified open wound of unspecified finger without damage to nail, initial encounter: Secondary | ICD-10-CM

## 2018-02-21 DIAGNOSIS — Y999 Unspecified external cause status: Secondary | ICD-10-CM | POA: Insufficient documentation

## 2018-02-21 DIAGNOSIS — F17200 Nicotine dependence, unspecified, uncomplicated: Secondary | ICD-10-CM | POA: Insufficient documentation

## 2018-02-21 DIAGNOSIS — Y939 Activity, unspecified: Secondary | ICD-10-CM | POA: Insufficient documentation

## 2018-02-21 MED ORDER — OXYCODONE-ACETAMINOPHEN 5-325 MG PO TABS
1.0000 | ORAL_TABLET | ORAL | Status: DC | PRN
  Administered 2018-02-21: 1 via ORAL
  Filled 2018-02-21: qty 1

## 2018-02-21 MED ORDER — IBUPROFEN 800 MG PO TABS: 800.0000 mg | ORAL_TABLET | Freq: Three times a day (TID) | ORAL | 0 refills | Status: DC | PRN

## 2018-02-21 MED ORDER — CLINDAMYCIN HCL 300 MG PO CAPS: 300.0000 mg | ORAL_CAPSULE | Freq: Three times a day (TID) | ORAL | 0 refills | Status: AC

## 2018-02-21 MED ORDER — OXYCODONE-ACETAMINOPHEN 5-325 MG PO TABS
1.0000 | ORAL_TABLET | Freq: Once | ORAL | Status: AC
  Administered 2018-02-21: 1 via ORAL
  Filled 2018-02-21: qty 1

## 2018-02-21 MED ORDER — LIDOCAINE HCL (PF) 1 % IJ SOLN
INTRAMUSCULAR | Status: AC
  Filled 2018-02-21: qty 5

## 2018-02-21 MED ORDER — OXYCODONE-ACETAMINOPHEN 5-325 MG PO TABS: 1.0000 | ORAL_TABLET | Freq: Four times a day (QID) | ORAL | 0 refills | Status: AC | PRN

## 2018-02-21 MED ORDER — LIDOCAINE HCL (PF) 1 % IJ SOLN
2.0000 mL | Freq: Once | INTRAMUSCULAR | Status: AC
  Administered 2018-02-21: 2 mL

## 2018-02-21 MED ORDER — LIDOCAINE HCL (PF) 1 % IJ SOLN
INTRAMUSCULAR | Status: AC
  Administered 2018-02-21: 2 mL
  Filled 2018-02-21: qty 5

## 2018-02-21 MED ORDER — TETANUS-DIPHTH-ACELL PERTUSSIS 5-2.5-18.5 LF-MCG/0.5 IM SUSP
0.5000 mL | Freq: Once | INTRAMUSCULAR | Status: AC
  Administered 2018-02-21: 0.5 mL via INTRAMUSCULAR
  Filled 2018-02-21: qty 0.5

## 2018-02-21 NOTE — ED Notes (Signed)
Pt ambulatory upon discharge; declined wheel chair. Verbalized understanding of discharge instructions, follow-up care, prescriptions, signs & symptoms of infection and splint care. VSS. A&O x4.

## 2018-02-21 NOTE — ED Notes (Signed)
ED Provider at bedside. 

## 2018-02-21 NOTE — ED Triage Notes (Addendum)
Left index finger shut in house door. Bleeding controlled. Nail and tip of finger gone.

## 2018-02-21 NOTE — ED Notes (Signed)
EDP aware pain has not decreased. Provider on her way in to numb area for further assessment.

## 2018-02-21 NOTE — ED Provider Notes (Signed)
[facility] Emergency Department Provider Note  ____________________________________________  Time seen: Approximately 2:29 PM  I have reviewed the triage vital signs and the nursing notes.   HISTORY  Chief Complaint Finger Injury    HPI Darlene Mcgee is a 32 y.o. female that presents emergency department for finger injury.  Patient states that she slammed her finger in a door. She is unsure of last tetanus. No additional injuries.   History reviewed. No pertinent past medical history.  There are no active problems to display for this patient.   Past Surgical History:  Procedure Laterality Date  . CESAREAN SECTION      Prior to Admission medications   Medication Sig Start Date End Date Taking? Authorizing Provider  clindamycin (CLEOCIN) 300 MG capsule Take 1 capsule (300 mg total) by mouth 3 (three) times daily for 10 days. 02/21/18 03/03/18  Enid DerryWagner, Enna Warwick, PA-C  ibuprofen (ADVIL,MOTRIN) 800 MG tablet Take 1 tablet (800 mg total) by mouth every 8 (eight) hours as needed. 02/21/18   Enid DerryWagner, Rafaelita Foister, PA-C  ondansetron (ZOFRAN ODT) 4 MG disintegrating tablet Take 1 tablet (4 mg total) by mouth every 8 (eight) hours as needed for nausea or vomiting. 10/20/15   Minna AntisPaduchowski, Kevin, MD  oxyCODONE-acetaminophen (PERCOCET) 5-325 MG tablet Take 1 tablet by mouth every 6 (six) hours as needed for up to 3 days for severe pain. 02/21/18 02/24/18  Enid DerryWagner, Leslie Jester, PA-C    Allergies Shellfish allergy  No family history on file.  Social History Social History   Tobacco Use  . Smoking status: Current Every Day Smoker  Substance Use Topics  . Alcohol use: Yes  . Drug use: No     Review of Systems  Gastrointestinal:  No nausea, no vomiting.  Musculoskeletal: Positive for finger pain  Skin: Negative for rash, ecchymosis. Neurological: Negative for numbness or tingling   ____________________________________________   PHYSICAL EXAM:  VITAL SIGNS: ED Triage  Vitals [02/21/18 1353]  Enc Vitals Group     BP (!) 140/100     Pulse Rate 99     Resp 18     Temp 99.4 F (37.4 C)     Temp Source Oral     SpO2 99 %     Weight 195 lb (88.5 kg)     Height      Head Circumference      Peak Flow      Pain Score 10     Pain Loc      Pain Edu?      Excl. in GC?      Constitutional: Alert and oriented. Well appearing and in no acute distress. Eyes: Conjunctivae are normal. PERRL. EOMI. Head: Atraumatic. ENT:      Ears:      Nose: No congestion/rhinnorhea.      Mouth/Throat: Mucous membranes are moist.  Neck: No stridor.  Cardiovascular: Normal rate, regular rhythm.  Good peripheral circulation. Respiratory: Normal respiratory effort without tachypnea or retractions. Lungs CTAB. Good air entry to the bases with no decreased or absent breath sounds. Musculoskeletal: Full range of motion to all extremities. No gross deformities appreciated. Neurologic:  Normal speech and language. No gross focal neurologic deficits are appreciated.  Skin:  Skin is warm, dry. Avulsion to tip of distal left index finger removing fingernail. No visible bone.  Psychiatric: Mood and affect are normal. Speech and behavior are normal. Patient exhibits appropriate insight and judgement.   ____________________________________________   LABS (all labs ordered are listed, but  only abnormal results are displayed)  Labs Reviewed - No data to display ____________________________________________  EKG   ____________________________________________  RADIOLOGY   Dg Finger Index Left  Result Date: 02/21/2018 CLINICAL DATA:  Finger shut in door EXAM: LEFT SECOND FINGER 2+V COMPARISON:  None. FINDINGS: Frontal, oblique, and lateral views were obtained. There is avulsion of the distal aspect of the second digit. There is a an obliquely oriented fracture involving the distal aspect of the second distal phalanx with alignment essentially anatomic. No other fracture. No  dislocation. Joint spaces appear normal. IMPRESSION: Fracture distal aspect second distal phalanx with alignment essentially anatomic. 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 No other fractures are evident. No dislocation or arthropathic change. Electronically Signed   By: Bretta Bang III M.D.   On: 02/21/2018 14:43    ____________________________________________    PROCEDURES  Procedure(s) performed:    Procedures    Medications  oxyCODONE-acetaminophen (PERCOCET/ROXICET) 5-325 MG per tablet 1 tablet (1 tablet Oral Given 02/21/18 1425)  lidocaine (PF) (XYLOCAINE) 1 % injection 2 mL (2 mLs Other Given 02/21/18 1504)  Tdap (BOOSTRIX) injection 0.5 mL (0.5 mLs Intramuscular Given 02/21/18 1612)  oxyCODONE-acetaminophen (PERCOCET/ROXICET) 5-325 MG per tablet 1 tablet (1 tablet Oral Given 02/21/18 1614)     ____________________________________________   INITIAL IMPRESSION / ASSESSMENT AND PLAN / ED COURSE  Pertinent labs & imaging results that were available during my care of the patient were reviewed by me and considered in my medical decision making (see chart for details).  Review of the  [place] was performed in accordance of the [facility] prior to dispensing any controlled drugs.     Patient's diagnosis is consistent with fingertip avulsion and open distal nondisplaced fracture. I do not see anything able to be repaired.  Wound was extensively cleaned with saline and iodine. Non adherent dressing and splint were placed. Tetanus shot was updated.  Patient will be discharged home with prescriptions for percocet, clindamycin, ibuprofen. Patient is to follow up with orthopedics as directed. Patient is given ED precautions to return to the ED for any worsening or new symptoms.     ____________________________________________  FINAL CLINICAL IMPRESSION(S) / ED DIAGNOSES  Final diagnoses:   Avulsion of finger tip, initial encounter  Open nondisplaced fracture of distal phalanx of left index finger, initial encounter      NEW MEDICATIONS STARTED DURING THIS VISIT:  ED Discharge Orders        Ordered    oxyCODONE-acetaminophen (PERCOCET) 5-325 MG tablet  Every 6 hours PRN     02/21/18 1554    ibuprofen (ADVIL,MOTRIN) 800 MG tablet  Every 8 hours PRN     02/21/18 1554    clindamycin (CLEOCIN) 300 MG capsule  3 times daily     02/21/18 1616          This chart was dictated using voice recognition software/Dragon. Despite best efforts to proofread, errors can occur which can change the meaning. Any change was purely unintentional.    Enid Derry, PA-C 02/21/18 1837    Jene Every, MD 02/23/18 [phone]

## 2018-05-09 LAB — HM HIV SCREENING LAB: HM HIV Screening: NEGATIVE

## 2019-07-16 IMAGING — DX DG FINGER INDEX 2+V*L*
3 series · 3 of 3 positions shown · non-contrast
Comparison: None.

CLINICAL DATA: Finger shut in door

EXAM:
LEFT SECOND FINGER 2+V

[finger ap]
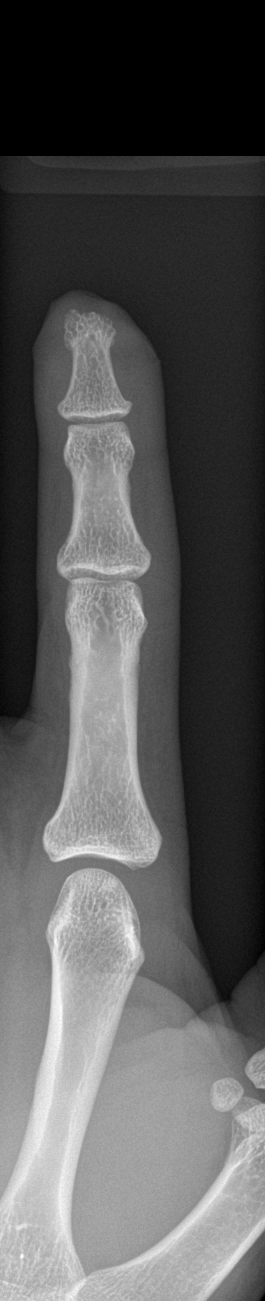

[finger obl]
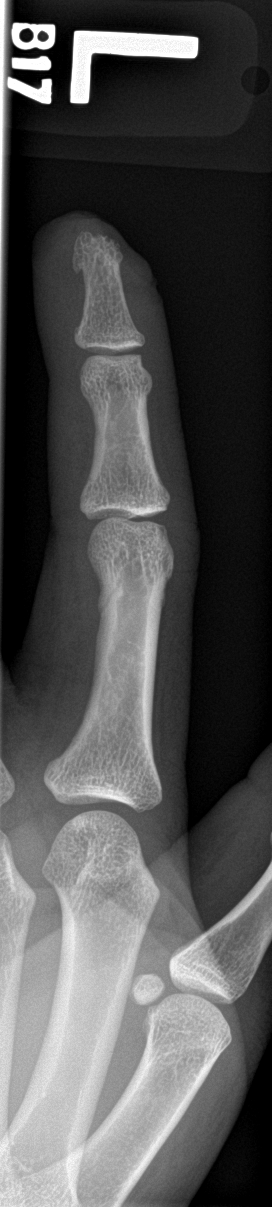

[finger lat]
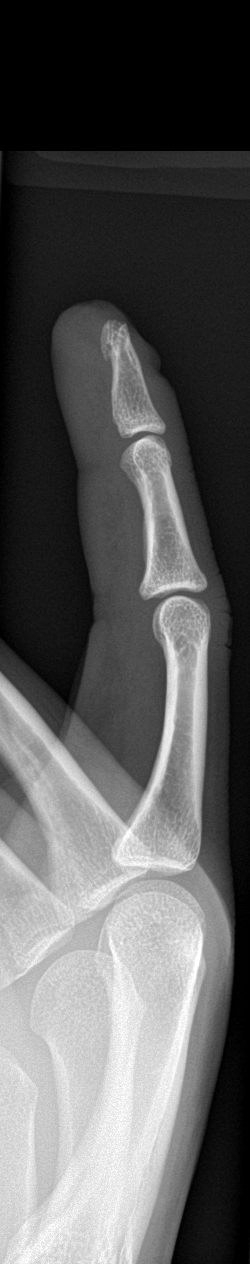

[3 of 3 positions shown; findings below may reference images not displayed]

FINDINGS: Frontal, oblique, and lateral views were obtained. There is avulsion
of the distal aspect of the second digit. There is a an obliquely
oriented fracture involving the distal aspect of the second distal
phalanx with alignment essentially anatomic. No other fracture. No
dislocation. Joint spaces appear normal.
IMPRESSION: Fracture distal aspect second distal phalanx with alignment
essentially anatomic. Amputation of the distal aspect of the second
digit involving a portion of the ungual region as well as soft
tissues distally. No other fractures are evident. No dislocation or
arthropathic change.

## 2020-01-23 DIAGNOSIS — Z62810 Personal history of physical and sexual abuse in childhood: Secondary | ICD-10-CM

## 2020-01-23 DIAGNOSIS — Z8659 Personal history of other mental and behavioral disorders: Secondary | ICD-10-CM

## 2020-01-24 ENCOUNTER — Ambulatory Visit: Payer: Self-pay

## 2020-01-27 ENCOUNTER — Ambulatory Visit: Payer: Self-pay

## 2020-01-27 ENCOUNTER — Other Ambulatory Visit: Payer: Self-pay

## 2020-01-27 ENCOUNTER — Ambulatory Visit: Payer: Self-pay | Admitting: Physician Assistant

## 2020-01-27 ENCOUNTER — Encounter: Payer: Self-pay | Admitting: Physician Assistant

## 2020-01-27 DIAGNOSIS — B9689 Other specified bacterial agents as the cause of diseases classified elsewhere: Secondary | ICD-10-CM

## 2020-01-27 DIAGNOSIS — Z113 Encounter for screening for infections with a predominantly sexual mode of transmission: Secondary | ICD-10-CM

## 2020-01-27 DIAGNOSIS — N760 Acute vaginitis: Secondary | ICD-10-CM

## 2020-01-27 DIAGNOSIS — A5901 Trichomonal vulvovaginitis: Secondary | ICD-10-CM

## 2020-01-27 LAB — WET PREP FOR TRICH, YEAST, CLUE
Trichomonas Exam: POSITIVE — AB
Yeast Exam: NEGATIVE

## 2020-01-27 MED ORDER — METRONIDAZOLE 500 MG PO TABS: 500.0000 mg | ORAL_TABLET | Freq: Two times a day (BID) | ORAL | 0 refills | Status: AC

## 2020-01-27 NOTE — Progress Notes (Signed)
Pt here for STD screening.Mehul Rudin, RN 

## 2020-01-27 NOTE — Progress Notes (Signed)
[employer] results reviewed by provider C. [place], [place]. Patient treated per provider orders for Trich and BV. Tawny Hopping, RN
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
## 2020-02-04 ENCOUNTER — Telehealth: Payer: Self-pay | Admitting: Family Medicine

## 2020-02-04 NOTE — Telephone Encounter (Addendum)
This RN returned pt's call at phone # provided. Pt reports that she is on Metronidazole and wants to know if it is ok to take pain medication while on the Metronidazole. Pt reports she is taking Midol. Consulted with Sadie Haber, PA regarding pt's question and per Sadie Haber, PA it is ok to take pain medication while on Metronidazole. Counseled pt per provider and pt states understanding. Pt with no other questions or concerns at this time.Lyman Speller, RN

## 2020-02-04 NOTE — Telephone Encounter (Signed)
Consulted by RN re:  patient question.  Reviewed RN note and agree with advice as documented.

## 2020-02-04 NOTE — Telephone Encounter (Signed)
Patient would like to know if it's okay to take pain medication with medication she is on. Patient stated she already took the pain medication.

## 2020-05-15 ENCOUNTER — Other Ambulatory Visit: Payer: Self-pay

## 2020-05-15 ENCOUNTER — Ambulatory Visit: Payer: Self-pay | Admitting: Family Medicine

## 2020-05-15 ENCOUNTER — Encounter: Payer: Self-pay | Admitting: Family Medicine

## 2020-05-15 DIAGNOSIS — Z113 Encounter for screening for infections with a predominantly sexual mode of transmission: Secondary | ICD-10-CM

## 2020-05-15 DIAGNOSIS — A599 Trichomoniasis, unspecified: Secondary | ICD-10-CM

## 2020-05-15 LAB — WET PREP FOR TRICH, YEAST, CLUE
Trichomonas Exam: POSITIVE — AB
Yeast Exam: NEGATIVE

## 2020-05-15 MED ORDER — METRONIDAZOLE 500 MG PO TABS: 500.0000 mg | ORAL_TABLET | Freq: Two times a day (BID) | ORAL | 0 refills | Status: AC
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
## 2020-05-16 NOTE — Progress Notes (Signed)
Reviewed ERRN documentation of visit on flowsheet and exam.  Agree that documentation meets requirements/guidelines for STD program and standing orders.  Consulted by [facility] about treatment for patient and given order to treat Trich with Metronidazole 500 mg #14 1 po BID for 7 days.  Will sign document since [facility] not cleared to sign documentation yet.

## 2020-11-12 ENCOUNTER — Other Ambulatory Visit: Payer: Self-pay

## 2020-11-12 ENCOUNTER — Encounter: Payer: Self-pay | Admitting: Family Medicine

## 2020-11-12 ENCOUNTER — Ambulatory Visit: Payer: Self-pay | Admitting: Family Medicine

## 2020-11-12 DIAGNOSIS — A599 Trichomoniasis, unspecified: Secondary | ICD-10-CM

## 2020-11-12 DIAGNOSIS — Z113 Encounter for screening for infections with a predominantly sexual mode of transmission: Secondary | ICD-10-CM

## 2020-11-12 MED ORDER — METRONIDAZOLE 500 MG PO TABS: 500.0000 mg | ORAL_TABLET | Freq: Two times a day (BID) | ORAL | 0 refills | Status: AC
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
## 2020-11-12 NOTE — Progress Notes (Signed)
Here for STD screen.  Patient states she knows she still has trich because she only took 2 days of meds and misplaced the rest.  Declines HIV/RPR Richmond Campbell, RN

## 2020-11-16 NOTE — Progress Notes (Signed)
Chart reviewed by Pharmacist  Suzanne Walker PharmD, Contract Pharmacist at [facility]  

## 2021-02-02 ENCOUNTER — Ambulatory Visit: Payer: Self-pay

## 2021-02-24 ENCOUNTER — Ambulatory Visit: Payer: Self-pay

## 2021-03-10 ENCOUNTER — Ambulatory Visit: Payer: Self-pay

## 2021-05-27 ENCOUNTER — Ambulatory Visit: Payer: Self-pay

## 2021-06-01 ENCOUNTER — Encounter: Payer: Self-pay | Admitting: Advanced Practice Midwife

## 2021-06-01 ENCOUNTER — Other Ambulatory Visit: Payer: Self-pay

## 2021-06-01 ENCOUNTER — Ambulatory Visit: Payer: Self-pay | Admitting: Advanced Practice Midwife

## 2021-06-01 DIAGNOSIS — R85619 Unspecified abnormal cytological findings in specimens from anus: Secondary | ICD-10-CM | POA: Insufficient documentation

## 2021-06-01 DIAGNOSIS — F17290 Nicotine dependence, other tobacco product, uncomplicated: Secondary | ICD-10-CM

## 2021-06-01 DIAGNOSIS — B9689 Other specified bacterial agents as the cause of diseases classified elsewhere: Secondary | ICD-10-CM

## 2021-06-01 DIAGNOSIS — Z113 Encounter for screening for infections with a predominantly sexual mode of transmission: Secondary | ICD-10-CM

## 2021-06-01 DIAGNOSIS — N760 Acute vaginitis: Secondary | ICD-10-CM

## 2021-06-01 DIAGNOSIS — F1290 Cannabis use, unspecified, uncomplicated: Secondary | ICD-10-CM

## 2021-06-01 LAB — WET PREP FOR TRICH, YEAST, CLUE
Clue Cell Exam: POSITIVE — AB
Trichomonas Exam: NEGATIVE
Yeast Exam: NEGATIVE
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
## 2021-06-01 NOTE — Progress Notes (Signed)
35 year old female in clinic today for a STD screening. Glenna Fellows, RN

## 2021-06-03 DIAGNOSIS — B9689 Other specified bacterial agents as the cause of diseases classified elsewhere: Secondary | ICD-10-CM

## 2021-06-03 DIAGNOSIS — N760 Acute vaginitis: Secondary | ICD-10-CM | POA: Insufficient documentation

## 2021-06-03 HISTORY — DX: Other specified bacterial agents as the cause of diseases classified elsewhere: B96.89

## 2021-06-03 MED ORDER — METRONIDAZOLE 500 MG PO TABS: 500.0000 mg | ORAL_TABLET | Freq: Two times a day (BID) | ORAL | 0 refills | Status: AC

## 2021-06-03 NOTE — Progress Notes (Signed)
I posted the patient and was told that she should be treated for BV by the provider that saw her for her visit.   Patient given Metronidazole 500 mg #14 1 po BID for 7 days per verbal order of E. Sciora, CNM.

## 2021-06-03 NOTE — Addendum Note (Signed)
Addended by: Sadie Haber on: 06/03/2021 03:55 PM   Modules accepted: Orders

## 2021-07-17 ENCOUNTER — Encounter: Payer: Self-pay | Admitting: Emergency Medicine

## 2021-07-17 ENCOUNTER — Emergency Department
Admission: EM | Admit: 2021-07-17 | Discharge: 2021-07-17 | Disposition: A | Discharge status: Home / Self Care | Payer: Self-pay | Attending: Emergency Medicine | Admitting: Emergency Medicine

## 2021-07-17 DIAGNOSIS — T24232A Burn of second degree of left lower leg, initial encounter: Secondary | ICD-10-CM | POA: Insufficient documentation

## 2021-07-17 DIAGNOSIS — F17210 Nicotine dependence, cigarettes, uncomplicated: Secondary | ICD-10-CM | POA: Insufficient documentation

## 2021-07-17 DIAGNOSIS — L03116 Cellulitis of left lower limb: Secondary | ICD-10-CM

## 2021-07-17 DIAGNOSIS — X16XXXA Contact with hot heating appliances, radiators and pipes, initial encounter: Secondary | ICD-10-CM | POA: Insufficient documentation

## 2021-07-17 MED ORDER — SULFAMETHOXAZOLE-TRIMETHOPRIM 800-160 MG PO TABS: 1.0000 | ORAL_TABLET | Freq: Two times a day (BID) | ORAL | 0 refills | Status: AC

## 2021-07-17 MED ORDER — BACITRACIN ZINC 500 UNIT/GM EX OINT
TOPICAL_OINTMENT | Freq: Once | CUTANEOUS | Status: AC
  Filled 2021-07-17: qty 0.9
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
## 2021-08-05 ENCOUNTER — Ambulatory Visit: Payer: Self-pay | Admitting: Physician Assistant

## 2021-08-11 ENCOUNTER — Ambulatory Visit: Payer: Self-pay | Admitting: Advanced Practice Midwife

## 2021-08-11 ENCOUNTER — Encounter: Payer: Self-pay | Admitting: Advanced Practice Midwife

## 2021-08-11 ENCOUNTER — Other Ambulatory Visit: Payer: Self-pay

## 2021-08-11 DIAGNOSIS — Z789 Other specified health status: Secondary | ICD-10-CM

## 2021-08-11 DIAGNOSIS — Z113 Encounter for screening for infections with a predominantly sexual mode of transmission: Secondary | ICD-10-CM

## 2021-08-11 DIAGNOSIS — Z62810 Personal history of physical and sexual abuse in childhood: Secondary | ICD-10-CM

## 2021-08-11 LAB — WET PREP FOR TRICH, YEAST, CLUE
Trichomonas Exam: NEGATIVE
Yeast Exam: NEGATIVE

## 2021-08-11 NOTE — Progress Notes (Signed)
Pt here for STD screening.  Wet mount results reviewed, no treatment required.  Pt declined condoms. Christean Silvestri M Ildefonso Keaney, RN  

## 2021-08-11 NOTE — Progress Notes (Deleted)
Pt here for Depo injection only.  Depo 150 mg given IM without any complications.  Pt given reminder card to return in 11-13 weeks for next Depo injection
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
## 2022-02-28 ENCOUNTER — Emergency Department
Admission: EM | Admit: 2022-02-28 | Discharge: 2022-02-28 | Disposition: A | Discharge status: Home / Self Care | Payer: Self-pay | Attending: Emergency Medicine | Admitting: Emergency Medicine

## 2022-02-28 ENCOUNTER — Other Ambulatory Visit: Payer: Self-pay

## 2022-02-28 ENCOUNTER — Encounter: Payer: Self-pay | Admitting: Emergency Medicine

## 2022-02-28 DIAGNOSIS — S0592XA Unspecified injury of left eye and orbit, initial encounter: Secondary | ICD-10-CM | POA: Insufficient documentation

## 2022-02-28 MED ORDER — FLUORESCEIN SODIUM 1 MG OP STRP
1.0000 | ORAL_STRIP | Freq: Once | OPHTHALMIC | Status: AC
  Administered 2022-02-28: 1 via OPHTHALMIC
  Filled 2022-02-28: qty 1

## 2022-02-28 MED ORDER — TETRACAINE HCL 0.5 % OP SOLN
1.0000 [drp] | Freq: Once | OPHTHALMIC | Status: AC
  Administered 2022-02-28: 1 [drp] via OPHTHALMIC
  Filled 2022-02-28: qty 4

## 2022-02-28 NOTE — ED Notes (Signed)
Left eye 20/200 Right eye 20/40 Both eyes 20/40

## 2022-06-10 ENCOUNTER — Inpatient Hospital Stay: Admission: RE | Admit: 2022-06-10 | Payer: Self-pay | Source: Ambulatory Visit

## 2022-08-02 ENCOUNTER — Telehealth: Payer: Self-pay | Admitting: Family Medicine

## 2022-08-02 NOTE — Telephone Encounter (Signed)
Pt requested to talk to a nurse or provider at the STI Clinic, but did not want to give any details; just that is a personal question she needs to ask. Thanks

## 2022-08-19 ENCOUNTER — Ambulatory Visit: Payer: Self-pay | Admitting: Nurse Practitioner

## 2022-08-19 ENCOUNTER — Encounter: Payer: Self-pay | Admitting: Nurse Practitioner

## 2022-08-19 DIAGNOSIS — Z113 Encounter for screening for infections with a predominantly sexual mode of transmission: Secondary | ICD-10-CM

## 2022-08-19 DIAGNOSIS — A599 Trichomoniasis, unspecified: Secondary | ICD-10-CM

## 2022-08-19 LAB — HEPATITIS B SURFACE ANTIGEN

## 2022-08-19 LAB — WET PREP FOR TRICH, YEAST, CLUE
Trichomonas Exam: POSITIVE — AB
Yeast Exam: NEGATIVE

## 2022-08-19 LAB — HM HEPATITIS C SCREENING LAB: HM Hepatitis Screen: NEGATIVE

## 2022-08-19 LAB — HM HIV SCREENING LAB: HM HIV Screening: NEGATIVE

## 2022-08-19 MED ORDER — METRONIDAZOLE 500 MG PO TABS: 500.0000 mg | ORAL_TABLET | Freq: Two times a day (BID) | ORAL | 0 refills | Status: AC
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
## 2022-08-23 ENCOUNTER — Telehealth: Payer: Self-pay

## 2022-08-23 NOTE — Telephone Encounter (Signed)
Hepatitis Serology form filled out 08/23/22 with corrected information and faxed to [facility] at number [phone], okay confirmation received.    Earlyne Iba, RN

## 2022-08-26 ENCOUNTER — Telehealth: Payer: Self-pay | Admitting: Family Medicine

## 2022-08-26 NOTE — Telephone Encounter (Signed)
Patient states she was given medication at her last visit but she forgot to take it yesterday. Patient states she misplaced the two medications that she was suppose to take the same day as her appointment. She wants to talk to a nurse.Please call

## 2022-09-27 ENCOUNTER — Ambulatory Visit: Payer: Self-pay | Admitting: Family Medicine

## 2022-09-27 ENCOUNTER — Encounter: Payer: Self-pay | Admitting: Family Medicine

## 2022-09-27 DIAGNOSIS — Z9851 Tubal ligation status: Secondary | ICD-10-CM

## 2022-09-27 DIAGNOSIS — Z113 Encounter for screening for infections with a predominantly sexual mode of transmission: Secondary | ICD-10-CM

## 2022-09-27 LAB — WET PREP FOR TRICH, YEAST, CLUE
Trichomonas Exam: NEGATIVE
Yeast Exam: NEGATIVE

## 2022-09-27 LAB — HM HIV SCREENING LAB: HM HIV Screening: NEGATIVE
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
## 2022-09-27 NOTE — Progress Notes (Addendum)
Pt here for STI screening.  Was treated approx 1 month ago for [place] and is wanting to see if infection has cleared. Wet mount results reviewed with pt.  No treatment needed at this time. Condoms declined-Maniyah Moller, RN

## 2023-12-11 ENCOUNTER — Other Ambulatory Visit: Payer: Self-pay

## 2023-12-11 ENCOUNTER — Emergency Department
Admission: EM | Admit: 2023-12-11 | Discharge: 2023-12-11 | Discharge status: Against Medical Advice | Payer: Self-pay | Attending: Emergency Medicine | Admitting: Emergency Medicine

## 2023-12-11 ENCOUNTER — Encounter: Payer: Self-pay | Admitting: *Deleted

## 2023-12-11 DIAGNOSIS — R197 Diarrhea, unspecified: Secondary | ICD-10-CM | POA: Insufficient documentation

## 2023-12-11 DIAGNOSIS — R112 Nausea with vomiting, unspecified: Secondary | ICD-10-CM | POA: Insufficient documentation

## 2023-12-11 DIAGNOSIS — Z5329 Procedure and treatment not carried out because of patient's decision for other reasons: Secondary | ICD-10-CM | POA: Insufficient documentation

## 2023-12-11 DIAGNOSIS — E876 Hypokalemia: Secondary | ICD-10-CM | POA: Insufficient documentation

## 2023-12-11 DIAGNOSIS — J45909 Unspecified asthma, uncomplicated: Secondary | ICD-10-CM | POA: Insufficient documentation

## 2023-12-11 LAB — COMPREHENSIVE METABOLIC PANEL WITH GFR
ALT: 38 U/L (ref 0–44)
AST: 32 U/L (ref 15–41)
Albumin: 3.7 g/dL (ref 3.5–5.0)
Alkaline Phosphatase: 61 U/L (ref 38–126)
Anion gap: 10 (ref 5–15)
BUN: 12 mg/dL (ref 6–20)
CO2: 26 mmol/L (ref 22–32)
Calcium: 8.5 mg/dL — ABNORMAL LOW (ref 8.9–10.3)
Chloride: 106 mmol/L (ref 98–111)
Creatinine, Ser: 0.93 mg/dL (ref 0.44–1.00)
GFR, Estimated: >60 mL/min (ref >60)
Glucose, Bld: 101 mg/dL — ABNORMAL HIGH (ref 70–99)
Potassium: 2.6 mmol/L — CL (ref 3.5–5.1)
Sodium: 142 mmol/L (ref 135–145)
Total Bilirubin: 0.9 mg/dL (ref 0.0–1.2)
Total Protein: 6.9 g/dL (ref 6.5–8.1)

## 2023-12-11 LAB — CBC
HCT: 39 % (ref 36.0–46.0)
Hemoglobin: 12.6 g/dL (ref 12.0–15.0)
MCH: 29.5 pg (ref 26.0–34.0)
MCHC: 32.3 g/dL (ref 30.0–36.0)
MCV: 91.3 fL (ref 80.0–100.0)
Platelets: 217 10*3/uL (ref 150–400)
RBC: 4.27 MIL/uL (ref 3.87–5.11)
RDW: 13.5 % (ref 11.5–15.5)
WBC: 9.3 10*3/uL (ref 4.0–10.5)
nRBC: 0 % (ref 0.0–0.2)

## 2023-12-11 LAB — LIPASE, BLOOD: Lipase: 29 U/L (ref 11–51)

## 2023-12-11 MED ORDER — ONDANSETRON HCL 4 MG/2ML IJ SOLN: 4.0000 mg | Freq: Once | INTRAMUSCULAR | Status: DC

## 2023-12-11 MED ORDER — SODIUM CHLORIDE 0.9 % IV BOLUS
1000.0000 mL | Freq: Once | INTRAVENOUS | Status: DC
Start: 2023-12-11 — End: 2023-12-12

## 2023-12-11 MED ORDER — POTASSIUM CHLORIDE 10 MEQ/100ML IV SOLN: 10.0000 meq | Freq: Once | INTRAVENOUS | Status: DC

## 2023-12-11 MED ORDER — POTASSIUM CHLORIDE ER 20 MEQ PO TBCR: 20.0000 meq | EXTENDED_RELEASE_TABLET | Freq: Two times a day (BID) | ORAL | 0 refills | Status: AC

## 2023-12-11 NOTE — ED Triage Notes (Signed)
 Pt ambulatory to triage.  Pt reports low abd pain with n/v/d .  Sx began last night.   No vaginal bleeding.  Denies dysuria.  Pt alert  speech clear.

## 2023-12-11 NOTE — Discharge Instructions (Signed)
 As discussed, you potassium level is low.  This can cause heart arrhythmias and other problems.  Take the oral potassium as prescribed.  Follow up with your primary care provider.  Return to the ER for new, worsening or persistent severe vomiting, diarrhea, abdominal pain, shakiness, tremors, weakness or any other new or worsening symptoms that concern you.  You may also return at any time if you change your mind and wish to resume your treatment in the ED.

## 2023-12-11 NOTE — ED Notes (Signed)
 After discussion with Dr Marisa Severin, Pt elected to leave AMA. Discussed recommended prescription. Pt verbalized understanding.

## 2023-12-11 NOTE — ED Provider Notes (Signed)
 [facility] Provider Note    Event Date/Time   First MD Initiated Contact with Patient 12/11/23 1951     (approximate)   History   Emesis   HPI  Darlene Mcgee is a 38 y.o. female with a history of obesity, asthma, and depression who presents with nausea, vomiting, and diarrhea since yesterday, multiple episodes, the most recent right before coming to the ED.  She reports associated crampy abdominal pain.  She states that she had been drinking heavily the night before the symptoms started and is worried about alcohol poisoning.  She denies urinary symptoms or fever.   I reviewed the past medical records; the patient's most recent documented outpatient encounter was with Family Medicine on 09/27/22 for STI screening.    Physical Exam   Triage Vital Signs: ED Triage Vitals  Encounter Vitals Group     BP 12/11/23 1833 135/78     Systolic BP Percentile --      Diastolic BP Percentile --      Pulse Rate 12/11/23 1833 65     Resp 12/11/23 1833 18     Temp 12/11/23 1833 99.3 F (37.4 C)     Temp Source 12/11/23 1833 Oral     SpO2 12/11/23 1833 98 %     Weight 12/11/23 1831 230 lb (104.3 kg)     Height 12/11/23 1831 5\' 5"  (1.651 m)     Head Circumference --      Peak Flow --      Pain Score 12/11/23 1830 4     Pain Loc --      Pain Education --      Exclude from Growth Chart --     Most recent vital signs: Vitals:   12/11/23 1833  BP: 135/78  Pulse: 65  Resp: 18  Temp: 99.3 F (37.4 C)  SpO2: 98%     General: Awake, no distress.  CV:  Good peripheral perfusion.  Resp:  Normal effort.  Abd:  No distention.  Soft and nontender.  Other:  Dry mucous membranes.    ED Results / Procedures / Treatments   Labs (all labs ordered are listed, but only abnormal results are displayed) Labs Reviewed  COMPREHENSIVE METABOLIC PANEL WITH GFR - Abnormal; Notable for the following components:      Result Value   Potassium 2.6 (*)    Glucose, Bld  101 (*)    Calcium 8.5 (*)    All other components within normal limits  LIPASE, BLOOD  CBC  URINALYSIS, ROUTINE W REFLEX MICROSCOPIC  POC URINE PREG, ED     EKG     RADIOLOGY    PROCEDURES:  Critical Care performed: No  Procedures   MEDICATIONS ORDERED IN ED: Medications  ondansetron (ZOFRAN) injection 4 mg (has no administration in time range)  sodium chloride 0.9 % bolus 1,000 mL (has no administration in time range)  potassium chloride 10 mEq in 100 mL IVPB (has no administration in time range)  potassium chloride 10 mEq in 100 mL IVPB (has no administration in time range)     IMPRESSION / MDM / ASSESSMENT AND PLAN / ED COURSE  I reviewed the triage vital signs and the nursing notes.  38 year old female with PMH as noted above presents with nausea, vomiting, and diarrhea starting yesterday and somewhat improved today.  She has now not had any vomiting in the last hour.  On exam the vital signs are normal except for  borderline elevated temp.  Abdomen is soft and nontender.    Differential diagnosis includes, but is not limited to, viral gastroenteritis, foodborne illness, gastritis, gastroparesis, pancreatitis.  CMP shows hypokalemia.  CBC shows no leukocytosis.  LFTs and lipase are normal.  We will give fluids, IV potassium, antiemetic, and reassess.  There is no indication for imaging at this time based on exam.    Patient's presentation is most consistent with acute presentation with potential threat to life or bodily function.  The patient is on the cardiac monitor to evaluate for evidence of arrhythmia and/or significant heart rate changes.  ----------------------------------------- 9:30 PM on 12/11/2023 -----------------------------------------  The patient states she wants to leave.  She is declining EKG, and all treatment.  She states she feels better, wants to eat, and is ready to go home.  I explained that hypokalemia can cause complications including,  but not limited to, cardiac dysrhythmia, cardiac arrest, weakness, syncope, permanent disability or death.  The patient demonstrates full decision making capacity, is able to paraphrase this back to me, and has appropriate understanding of the risks.  I have prescribed oral potassium which she says she is amenable to taking.  She understands she may return at any time if she changes her mind.  She is leaving against medical advice.       FINAL CLINICAL IMPRESSION(S) / ED DIAGNOSES   Final diagnoses:  Nausea vomiting and diarrhea  Hypokalemia     Rx / DC Orders   ED Discharge Orders          Ordered    potassium chloride 20 MEQ TBCR  2 times daily        12/11/23 2129             Note:  This document was prepared using Dragon voice recognition software and may include unintentional dictation errors.    Dionne Bucy, MD 12/11/23 2132

## 2023-12-11 NOTE — ED Notes (Signed)
 Entered room to meet patient and obtain IV access. Pt stated that she was refusing all care and just wanted to leave. She was not interested in discussing her decision. Dr Marisa Severin notified and went in to speak with the patient.

## 2024-04-02 ENCOUNTER — Ambulatory Visit: Admitting: Family Medicine

## 2024-04-02 ENCOUNTER — Encounter: Payer: Self-pay | Admitting: Family Medicine

## 2024-04-02 DIAGNOSIS — B3731 Acute candidiasis of vulva and vagina: Secondary | ICD-10-CM

## 2024-04-02 DIAGNOSIS — Z113 Encounter for screening for infections with a predominantly sexual mode of transmission: Secondary | ICD-10-CM

## 2024-04-02 LAB — WET PREP FOR TRICH, YEAST, CLUE
Clue Cell Exam: NEGATIVE
Trichomonas Exam: NEGATIVE
Yeast Exam: NEGATIVE

## 2024-04-02 MED ORDER — FLUCONAZOLE 150 MG PO TABS: 150.0000 mg | ORAL_TABLET | ORAL | Status: AC

## 2024-04-02 NOTE — Patient Instructions (Signed)
 STI screening - Today we obtained a vaginal swab to screen for gonorrhea, chlamydia, and trichomonas - If the results are abnormal, I will give you a call.    Estimated time frame for results collected at the [facility] Department: Same day Trichomonas Yeast BV (bacterial vaginosis)  Within 1-2 weeks Gonorrhea Chlamydia  Within 2-3 weeks HIV Syphilis Hepatitis B Hepatitis C

## 2024-04-02 NOTE — Progress Notes (Signed)
 Pt is here for std screening. Treated per provider order. The patient was dispensed fluconazole #2 today. I provided counseling today regarding the medication. We discussed the medication, the side effects and when to call clinic. Patient given the opportunity to ask questions. Questions answered.  Larraine JONELLE Northern, RN
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
